# Patient Record
Sex: Female | Born: 1980 | Race: White | Hispanic: No | Marital: Married | Smoking: Never smoker
Health system: Southern US, Community
[De-identification: ages and names within clinical notes are randomized; demographics above are authoritative.]

## PROBLEM LIST (undated history)

## (undated) DIAGNOSIS — G4730 Sleep apnea, unspecified: Secondary | ICD-10-CM

## (undated) DIAGNOSIS — O24419 Gestational diabetes mellitus in pregnancy, unspecified control: Secondary | ICD-10-CM

## (undated) HISTORY — PX: TUBAL LIGATION: SHX77

## (undated) HISTORY — DX: Sleep apnea, unspecified: G47.30

---

## 2003-03-08 ENCOUNTER — Other Ambulatory Visit: Admission: RE | Admit: 2003-03-08 | Discharge: 2003-03-08 | Payer: Self-pay | Admitting: Obstetrics and Gynecology

## 2003-07-04 ENCOUNTER — Ambulatory Visit (HOSPITAL_COMMUNITY): Admission: RE | Admit: 2003-07-04 | Discharge: 2003-07-04 | Payer: Self-pay | Admitting: Obstetrics and Gynecology

## 2003-10-12 ENCOUNTER — Inpatient Hospital Stay (HOSPITAL_COMMUNITY): Admission: AD | Admit: 2003-10-12 | Discharge: 2003-10-12 | Payer: Self-pay | Admitting: Obstetrics and Gynecology

## 2003-10-31 ENCOUNTER — Ambulatory Visit (HOSPITAL_COMMUNITY): Admission: RE | Admit: 2003-10-31 | Discharge: 2003-10-31 | Payer: Self-pay | Admitting: Obstetrics and Gynecology

## 2003-11-10 ENCOUNTER — Inpatient Hospital Stay (HOSPITAL_COMMUNITY): Admission: AD | Admit: 2003-11-10 | Discharge: 2003-11-10 | Payer: Self-pay | Admitting: Obstetrics and Gynecology

## 2003-11-10 ENCOUNTER — Inpatient Hospital Stay (HOSPITAL_COMMUNITY): Admission: AD | Admit: 2003-11-10 | Discharge: 2003-11-13 | Payer: Self-pay | Admitting: Obstetrics and Gynecology

## 2004-05-28 ENCOUNTER — Ambulatory Visit: Payer: Self-pay | Admitting: Family Medicine

## 2005-02-18 ENCOUNTER — Ambulatory Visit: Payer: Self-pay | Admitting: Family Medicine

## 2005-06-11 ENCOUNTER — Ambulatory Visit: Payer: Self-pay | Admitting: Family Medicine

## 2005-06-30 ENCOUNTER — Ambulatory Visit: Payer: Self-pay | Admitting: Family Medicine

## 2006-01-26 ENCOUNTER — Ambulatory Visit: Payer: Self-pay | Admitting: Family Medicine

## 2007-03-22 ENCOUNTER — Encounter: Admission: RE | Admit: 2007-03-22 | Discharge: 2007-03-22 | Payer: Self-pay | Admitting: Obstetrics and Gynecology

## 2007-05-19 ENCOUNTER — Inpatient Hospital Stay (HOSPITAL_COMMUNITY): Admission: RE | Admit: 2007-05-19 | Discharge: 2007-05-21 | Payer: Self-pay | Admitting: Obstetrics and Gynecology

## 2007-08-27 ENCOUNTER — Ambulatory Visit: Payer: Self-pay | Admitting: Specialist

## 2008-02-21 ENCOUNTER — Ambulatory Visit: Payer: Self-pay | Admitting: Family Medicine

## 2008-02-21 DIAGNOSIS — N390 Urinary tract infection, site not specified: Secondary | ICD-10-CM

## 2008-02-21 DIAGNOSIS — R1011 Right upper quadrant pain: Secondary | ICD-10-CM

## 2008-02-21 DIAGNOSIS — I10 Essential (primary) hypertension: Secondary | ICD-10-CM | POA: Insufficient documentation

## 2008-02-21 LAB — CONVERTED CEMR LAB
Glucose, Urine, Semiquant: NEGATIVE
Ketones, urine, test strip: NEGATIVE
Specific Gravity, Urine: 1.025
Urobilinogen, UA: 1
pH: 5.5

## 2008-03-14 ENCOUNTER — Telehealth: Payer: Self-pay | Admitting: Family Medicine

## 2008-10-24 ENCOUNTER — Ambulatory Visit: Payer: Self-pay | Admitting: Family Medicine

## 2008-10-24 DIAGNOSIS — J309 Allergic rhinitis, unspecified: Secondary | ICD-10-CM | POA: Insufficient documentation

## 2008-10-24 LAB — CONVERTED CEMR LAB: Rapid Strep: NEGATIVE

## 2008-10-26 LAB — CONVERTED CEMR LAB
Albumin: 3.8 g/dL (ref 3.5–5.2)
BUN: 11 mg/dL (ref 6–23)
Basophils Absolute: 0.1 10*3/uL (ref 0.0–0.1)
CO2: 28 meq/L (ref 19–32)
Chloride: 108 meq/L (ref 96–112)
Cholesterol: 182 mg/dL (ref 0–200)
Glucose, Bld: 82 mg/dL (ref 70–99)
HCT: 37.6 % (ref 36.0–46.0)
Lymphs Abs: 1.5 10*3/uL (ref 0.7–4.0)
MCHC: 34.4 g/dL (ref 30.0–36.0)
MCV: 81.1 fL (ref 78.0–100.0)
Monocytes Absolute: 0.3 10*3/uL (ref 0.1–1.0)
Neutro Abs: 3.7 10*3/uL (ref 1.4–7.7)
Platelets: 222 10*3/uL (ref 150.0–400.0)
Potassium: 3.9 meq/L (ref 3.5–5.1)
RDW: 13.6 % (ref 11.5–14.6)
TSH: 0.87 microintl units/mL (ref 0.35–5.50)
Total Bilirubin: 0.6 mg/dL (ref 0.3–1.2)
VLDL: 15.4 mg/dL (ref 0.0–40.0)

## 2010-07-21 ENCOUNTER — Encounter: Payer: Self-pay | Admitting: Family Medicine

## 2010-11-14 NOTE — H&P (Signed)
NAMEHARVEEN, [name]                 ACCOUNT NO.:  [id number]   MEDICAL RECORD NO.:  [id number]          PATIENT TYPE:  INP   LOCATION:  9171                          FACILITY:  [facility]   PHYSICIAN:  Lenoard Aden, M.D.DATE OF BIRTH:  Jul 31, 1980   DATE OF ADMISSION:  05/19/2007  DATE OF DISCHARGE:                              HISTORY & PHYSICAL   INDICATION FOR INDUCTION:  Chronic hypertension and gestational  diabetes.   She is a 30 year old white female G2, P1 at [redacted] weeks gestation for  induction.  She has a history of chronic hypertension, on labetalol.  She has a history of gestational diabetes, well-controlled on diet.   CURRENT MEDICATIONS:  1. Prenatal vitamins.  2. Labetalol 100 mg b.i.d.   SOCIAL HISTORY:  She is a nonsmoker, nondrinker.  She denies domestic  physical violence.   ALLERGIES:  HYDROCODONE.   PAST MEDICAL HISTORY:  She has a history of anemia and urinary tract  infection.   FAMILY HISTORY:  She has a family history remarkable for heart disease,  diabetes, thyroid COPD, stroke and bipolar disorder.   OBSTETRIC AND GYNECOLOGICAL HISTORY:  Previous vaginal delivery of a 7  pound 7 ounce female at 22 weeks in 2005.   PHYSICAL EXAMINATION:  GENERAL:  She is a well-developed, well-nourished  white female in no acute distress.  HEENT: Normal.  LUNGS: Clear.  HEART.  Regular rate and rhythm.  ABDOMEN:  Soft, gravid, nontender.  Estimated fetal weight 8 to 8-1/2  pounds.  Cervix is 2-3 cm, __________  , vertex, -1.  EXTREMITIES:  __________ .  NEUROLOGIC:  Nonfocal.  SKIN:  Intact.  VITAL SIGNS:  Blood pressure is as noted, 126/53.   LABORATORY DATA:  Glucose was pending.   IMPRESSION:  1. A 38-week intrauterine pregnancy.  2. Chronic hypertension on Labetalol.  3. Gestational diabetes.   PLAN:  Proceed with induction.  Risks and benefits discussed.      Lenoard Aden, M.D.  Electronically Signed     RJT/MEDQ  D:  05/19/2007  T:   05/19/2007  Job:  [id number]

## 2010-11-20 ENCOUNTER — Encounter: Payer: Self-pay | Admitting: Obstetrics & Gynecology

## 2010-11-27 ENCOUNTER — Encounter: Payer: Self-pay | Admitting: Maternal and Fetal Medicine

## 2010-12-04 ENCOUNTER — Encounter: Payer: Self-pay | Admitting: Maternal & Fetal Medicine

## 2010-12-11 ENCOUNTER — Encounter: Payer: Self-pay | Admitting: Obstetrics and Gynecology

## 2010-12-11 IMAGING — US ULTRAOUND OB LIMITED - NRPT [facility]
1 series · 13 of 13 positions shown · non-contrast
Comparison: none

[Series 1: ultraound ob limited - nrpt [facility] · 13 of 13 slices shown]
[im 1/13]
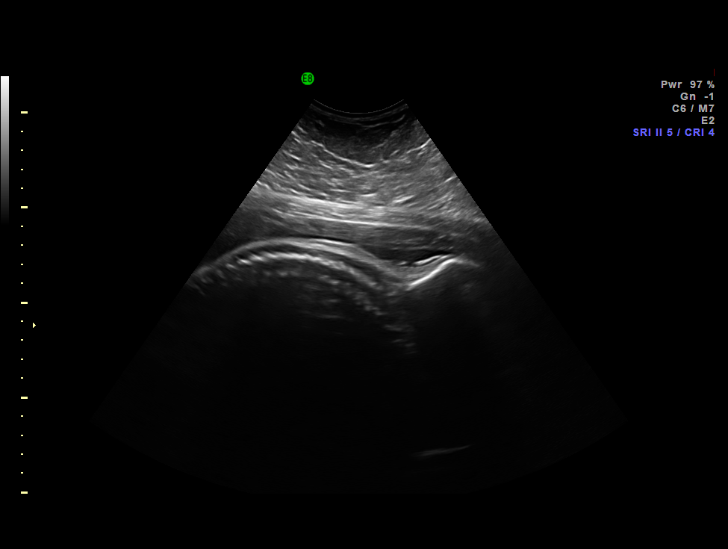
[im 2/13]
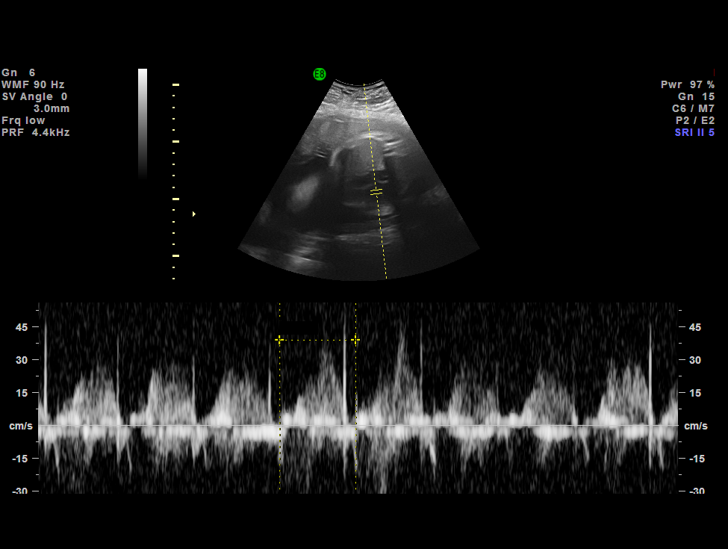
[im 3/13]
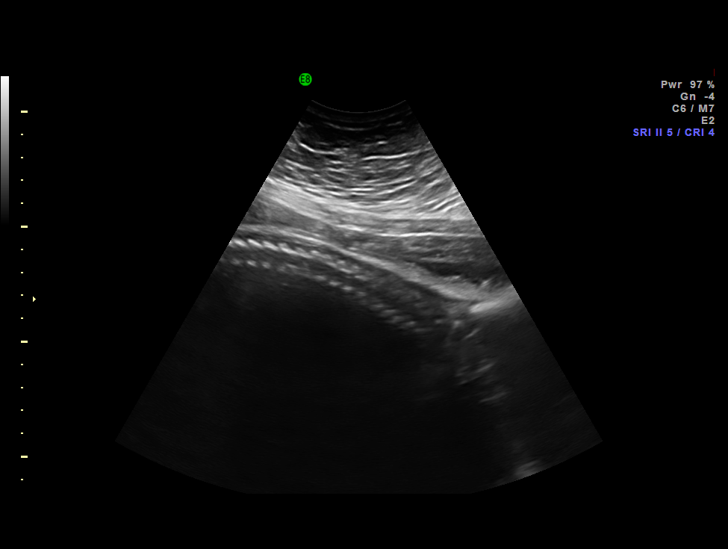
[im 4/13]
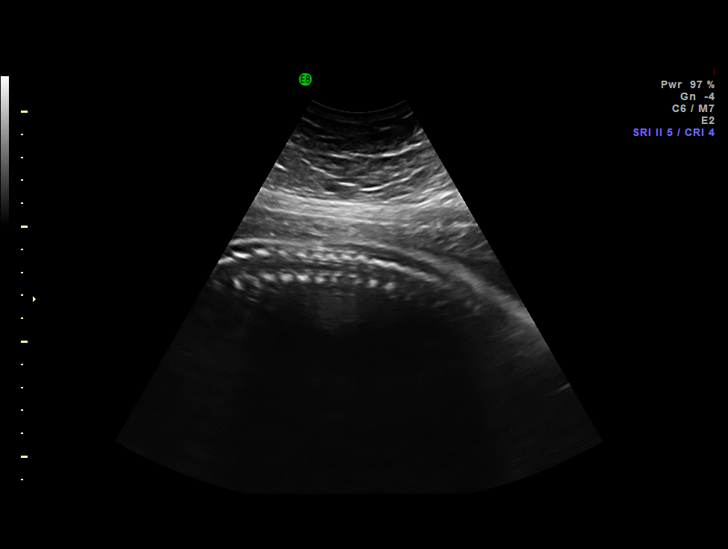
[im 5/13]
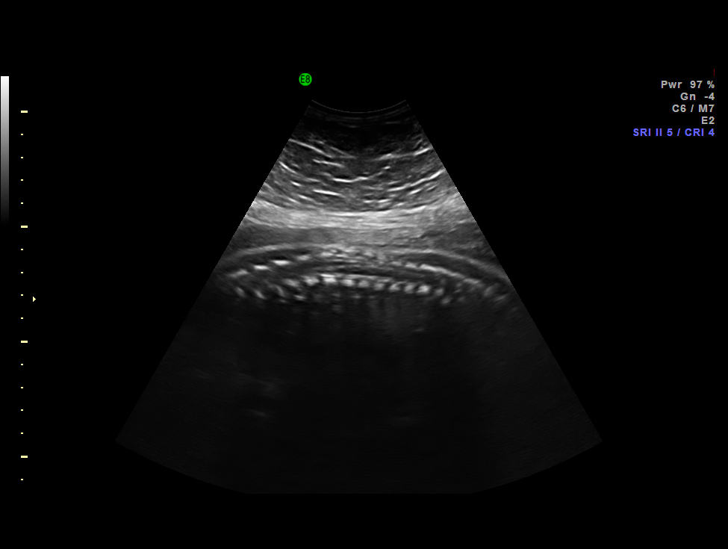
[im 6/13]
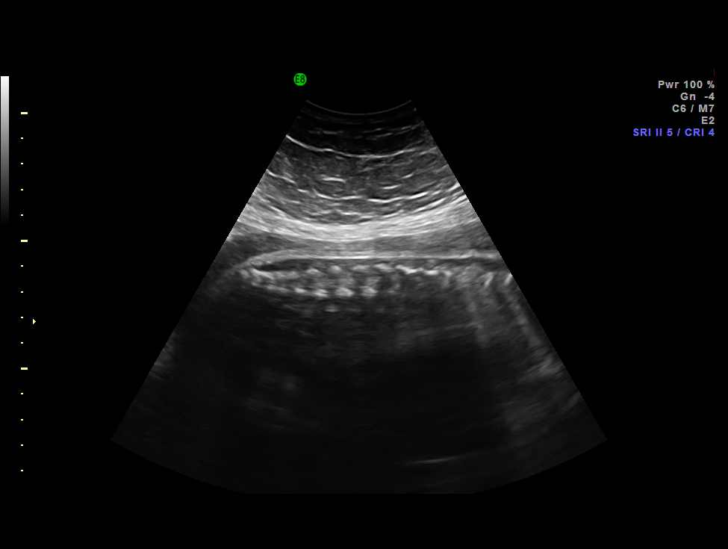
[im 7/13]
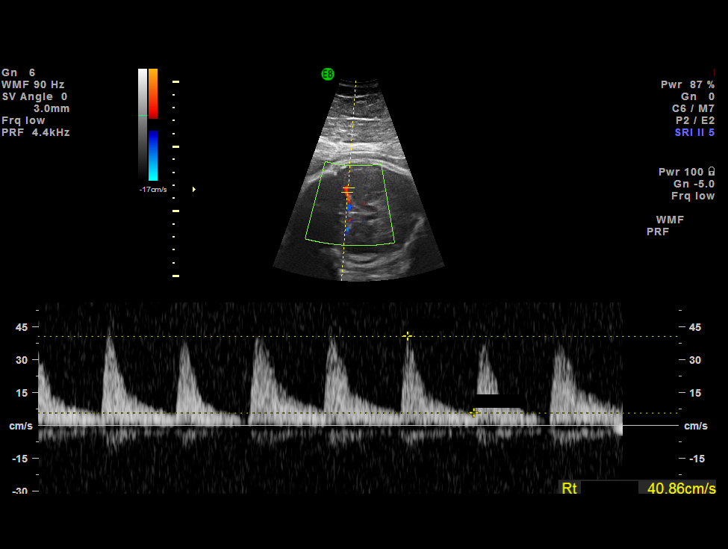
[im 8/13]
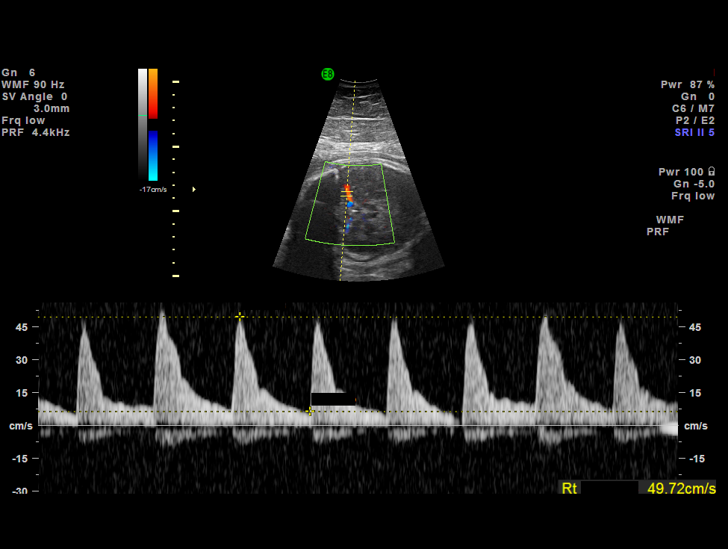
[im 9/13]
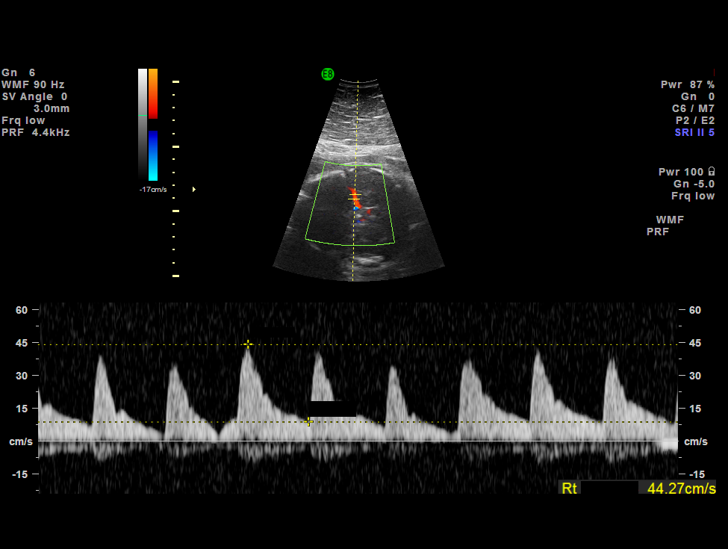
[im 10/13]
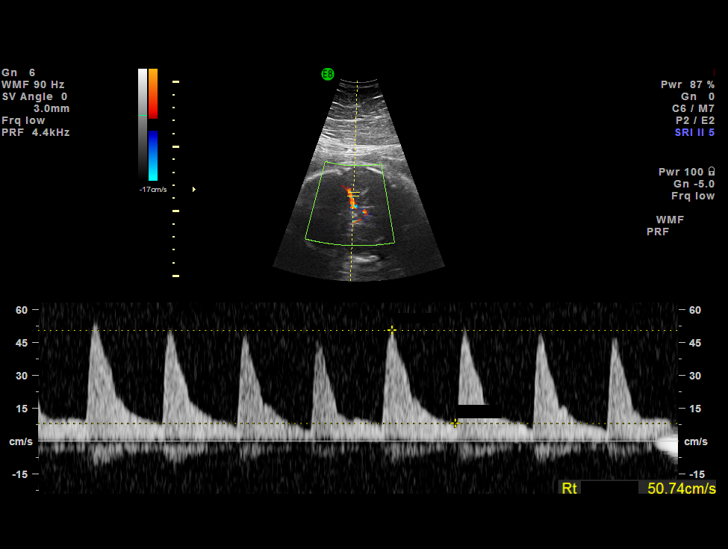
[im 11/13]
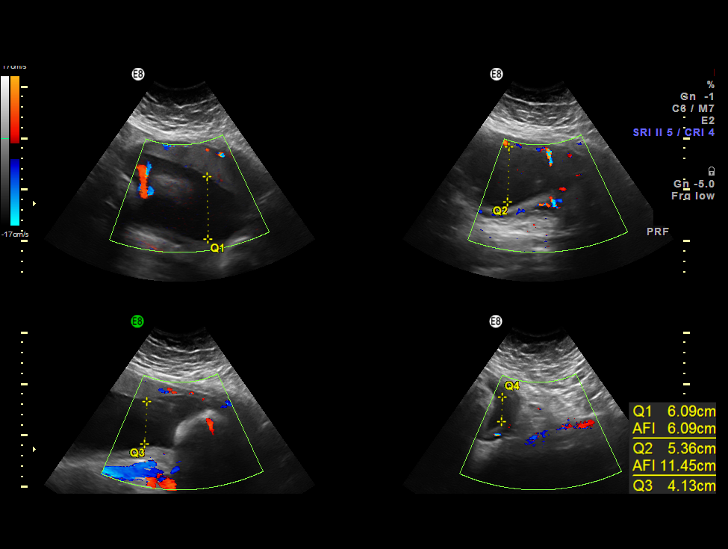
[im 12/13]
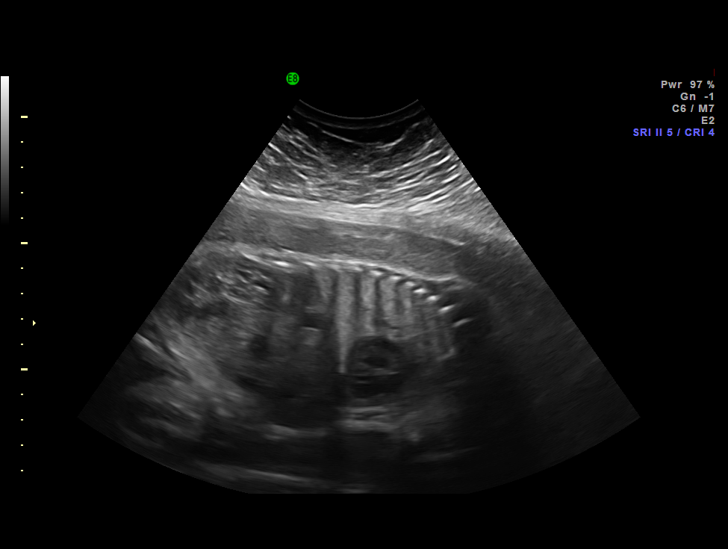
[im 13/13]
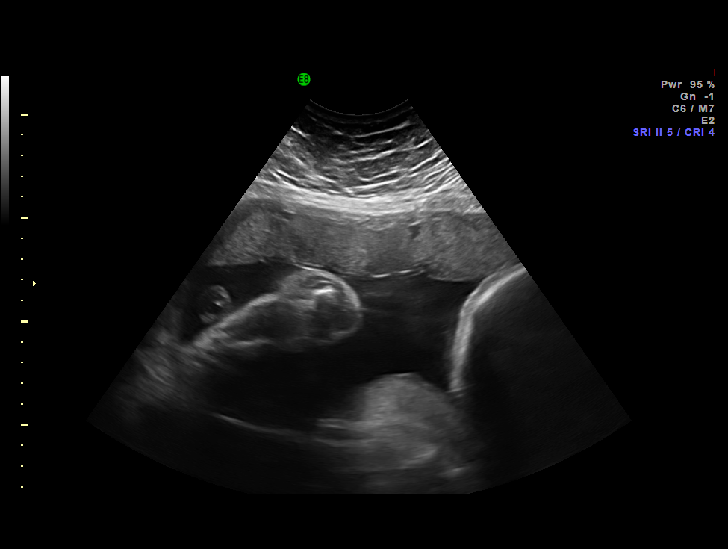

[13 of 13 positions shown; findings below may reference images not displayed]

IMAGES IMPORTED FROM THE SYNGO WORKFLOW SYSTEM
NO DICTATION FOR STUDY

## 2010-12-18 ENCOUNTER — Encounter: Payer: Self-pay | Admitting: Maternal & Fetal Medicine

## 2010-12-25 ENCOUNTER — Encounter: Payer: Self-pay | Admitting: Maternal & Fetal Medicine

## 2011-02-08 ENCOUNTER — Observation Stay: Payer: Self-pay | Admitting: Advanced Practice Midwife

## 2011-02-16 ENCOUNTER — Inpatient Hospital Stay: Payer: Self-pay

## 2011-02-20 LAB — PATHOLOGY REPORT

## 2011-03-10 ENCOUNTER — Ambulatory Visit: Payer: Self-pay

## 2011-04-07 LAB — CBC
HCT: 37.3
Hemoglobin: 13
MCV: 82.7
Platelets: 199
Platelets: 214
RBC: 3.87
WBC: 7.2
WBC: 8.9

## 2011-04-07 LAB — RPR: RPR Ser Ql: NONREACTIVE

## 2011-04-30 ENCOUNTER — Emergency Department: Payer: Self-pay | Admitting: *Deleted

## 2014-06-02 ENCOUNTER — Emergency Department: Payer: Self-pay | Admitting: Student

## 2016-08-13 ENCOUNTER — Ambulatory Visit
Admission: EM | Admit: 2016-08-13 | Discharge: 2016-08-13 | Disposition: A | Discharge status: Home / Self Care | Payer: BLUE CROSS/BLUE SHIELD | Attending: Emergency Medicine | Admitting: Emergency Medicine

## 2016-08-13 ENCOUNTER — Encounter: Payer: Self-pay | Admitting: *Deleted

## 2016-08-13 DIAGNOSIS — J069 Acute upper respiratory infection, unspecified: Secondary | ICD-10-CM | POA: Diagnosis not present

## 2016-08-13 HISTORY — DX: Gestational diabetes mellitus in pregnancy, unspecified control: O24.419

## 2016-08-13 MED ORDER — PSEUDOEPHEDRINE-GUAIFENESIN ER 60-600 MG PO TB12: 1.0000 | ORAL_TABLET | Freq: Two times a day (BID) | ORAL | 0 refills | Status: DC

## 2016-08-13 MED ORDER — FLUCONAZOLE 150 MG PO TABS: ORAL_TABLET | ORAL | 1 refills | Status: DC

## 2016-08-13 MED ORDER — BENZONATATE 100 MG PO CAPS: ORAL_CAPSULE | ORAL | 0 refills | Status: DC

## 2016-08-13 MED ORDER — AMOXICILLIN-POT CLAVULANATE 875-125 MG PO TABS: 1.0000 | ORAL_TABLET | Freq: Two times a day (BID) | ORAL | 0 refills | Status: DC

## 2016-08-13 NOTE — ED Triage Notes (Signed)
Patient started having symptoms of cough, nasal congestion, and head ache 1 week ago.

## 2016-08-13 NOTE — ED Provider Notes (Signed)
CSN: [id number]     Arrival date & time 08/13/16  1037 History   First MD Initiated Contact with Patient 08/13/16 1323     Chief Complaint  Patient presents with  . Cough  . Nasal Congestion   (Consider location/radiation/quality/duration/timing/severity/associated sxs/prior Treatment) HPI  This is a 36 year old female who presents with cough shin and sinus headache. The cough is productive of yellow mucus. She's had sinus pressure along with a headache. Pressure constant and she says is worse with bending over. She has subjective fever and chills. She's had no shortness of breath wheezing over-the-counter medications a day, NyQuil have not been beneficial. He has been using a nasal spray which has given her some relief.        Past Medical History:  Diagnosis Date  . Gestational diabetes    Past Surgical History:  Procedure Laterality Date  . TUBAL LIGATION     Family History  Problem Relation Age of Onset  . Diabetes Mother   . Hypertension Mother   . CAD Mother   . Diabetes Father   . Hypertension Father   . CAD Father    Social History  Substance Use Topics  . Smoking status: Never Smoker  . Smokeless tobacco: Never Used  . Alcohol use No   OB History    No data available     Review of Systems  Constitutional: Positive for activity change, chills, fatigue and fever.  HENT: Positive for congestion, postnasal drip, sinus pain and sinus pressure.   Respiratory: Positive for cough, shortness of breath and wheezing.   All other systems reviewed and are negative.   Allergies  Hydrocodone  Home Medications   Prior to Admission medications   Medication Sig Start Date End Date Taking? Authorizing Provider  amoxicillin-clavulanate (AUGMENTIN) 875-125 MG tablet Take 1 tablet by mouth every 12 (twelve) hours. 08/13/16   Lutricia FeilWilliam P Roemer, PA-C  benzonatate (TESSALON) 100 MG capsule Take one cap TID PRN cough 08/13/16   Lutricia FeilWilliam P Roemer, PA-C  fluconazole (DIFLUCAN)  150 MG tablet Take 1 tablet once for symptoms of yeast infection. 08/13/16   Lutricia FeilWilliam P Roemer, PA-C  pseudoephedrine-guaifenesin (MUCINEX D) 60-600 MG 12 hr tablet Take 1 tablet by mouth every 12 (twelve) hours. 08/13/16   Lutricia FeilWilliam P Roemer, PA-C   Meds Ordered and Administered this Visit  Medications - No data to display  BP (!) 131/91 (BP Location: Left Wrist)   Pulse 73   Temp 98.2 F (36.8 C) (Oral)   Resp 16   Ht 5\' 3"  (1.6 m)   Wt 280 lb (127 kg)   LMP 08/06/2016   SpO2 100%   BMI 49.60 kg/m  No data found.   Physical Exam  Constitutional: She is oriented to person, place, and time. She appears well-developed and well-nourished. No distress.  HENT:  Head: Normocephalic and atraumatic.  Right Ear: External ear normal.  Left Ear: External ear normal.  Nose: Nose normal.  Mouth/Throat: Oropharynx is clear and moist. No oropharyngeal exudate.  She does have tenderness to percussion over the sinuses  Eyes: EOM are normal. Pupils are equal, round, and reactive to light. Right eye exhibits no discharge. Left eye exhibits no discharge.  Neck: Normal range of motion. Neck supple.  Pulmonary/Chest: Effort normal and breath sounds normal. No respiratory distress. She has no wheezes. She has no rales.  Musculoskeletal: Normal range of motion.  Lymphadenopathy:    She has no cervical adenopathy.  Neurological: She is alert and oriented  to person, place, and time.  Skin: Skin is warm and dry. She is not diaphoretic.  Psychiatric: She has a normal mood and affect. Her behavior is normal. Judgment and thought content normal.  Nursing note and vitals reviewed.   Urgent Care Course     Procedures (including critical care time)  Labs Review Labs Reviewed - No data to display  Imaging Review No results found.   Visual Acuity Review  Right Eye Distance:   Left Eye Distance:   Bilateral Distance:    Right Eye Near:   Left Eye Near:    Bilateral Near:         MDM   1.  Acute upper respiratory infection    New Prescriptions   AMOXICILLIN-CLAVULANATE (AUGMENTIN) 875-125 MG TABLET    Take 1 tablet by mouth every 12 (twelve) hours.   BENZONATATE (TESSALON) 100 MG CAPSULE    Take one cap TID PRN cough   FLUCONAZOLE (DIFLUCAN) 150 MG TABLET    Take 1 tablet once for symptoms of yeast infection.   PSEUDOEPHEDRINE-GUAIFENESIN (MUCINEX D) 60-600 MG 12 HR TABLET    Take 1 tablet by mouth every 12 (twelve) hours.  Plan: 1. Test/x-ray results and diagnosis reviewed with patient 2. rx as per orders; risks, benefits, potential side effects reviewed with patient 3. Recommend supportive treatment with Fluids,rest. Tylenol or Motrin for fevers and body aches. Denies with her nasal spray. Follow-up with primary care she is not improving 4. F/u prn if symptoms worsen or don't improve     Lutricia Feil, PA-C 08/13/16 1403

## 2017-03-21 ENCOUNTER — Emergency Department
Admission: EM | Admit: 2017-03-21 | Discharge: 2017-03-21 | Disposition: A | Discharge status: Home / Self Care | Payer: BLUE CROSS/BLUE SHIELD | Attending: Emergency Medicine | Admitting: Emergency Medicine

## 2017-03-21 ENCOUNTER — Encounter: Payer: Self-pay | Admitting: Emergency Medicine

## 2017-03-21 DIAGNOSIS — K0889 Other specified disorders of teeth and supporting structures: Secondary | ICD-10-CM

## 2017-03-21 DIAGNOSIS — I10 Essential (primary) hypertension: Secondary | ICD-10-CM | POA: Diagnosis not present

## 2017-03-21 MED ORDER — PENICILLIN V POTASSIUM 500 MG PO TABS: 500.0000 mg | ORAL_TABLET | Freq: Four times a day (QID) | ORAL | 0 refills | Status: AC

## 2017-03-21 MED ORDER — NAPROXEN 500 MG PO TABS: 500.0000 mg | ORAL_TABLET | Freq: Two times a day (BID) | ORAL | 0 refills | Status: AC

## 2017-03-21 NOTE — ED Triage Notes (Signed)
Pt reports having pain to left lower gum where wisdom tooth is coming in.  Reports pain was so bad yesterday saw spots and blood pressure was up from it.  Does not have a Education officer, community.

## 2017-03-21 NOTE — ED Provider Notes (Signed)
[facility] Emergency Department Provider Note  ___________________________________________   First MD Initiated Contact with Patient 03/21/17 1523     (approximate)  I have reviewed the triage vital signs and the nursing notes.   HISTORY  Chief Complaint Dental Pain  HPI [name] is a 36 y.o. female is her complaint of dental pain for 5 weeks.Patient states she also blew she has TMJ area she has had some popping in her jaw especially when she is eating things. She is not taking any medication for this. She states that she does not have a dentist at this time has not made an appointment. She denies any fever or chills. Currently she rates her pain as 9/10.   Past Medical History:  Diagnosis Date  . Gestational diabetes     Patient Active Problem List   Diagnosis Date Noted  . ALLERGIC RHINITIS 10/24/2008  . HYPERTENSION 02/21/2008  . UTI'S, CHRONIC 02/21/2008  . ABDOMINAL PAIN RIGHT UPPER QUADRANT 02/21/2008    Past Surgical History:  Procedure Laterality Date  . TUBAL LIGATION      Prior to Admission medications   Medication Sig Start Date End Date Taking? Authorizing Provider  naproxen (NAPROSYN) 500 MG tablet Take 1 tablet (500 mg total) by mouth 2 (two) times daily with a meal. 03/21/17   Bridget Hartshorn L, PA-C  penicillin v potassium (VEETID) 500 MG tablet Take 1 tablet (500 mg total) by mouth 4 (four) times daily. 03/21/17   Tommi Rumps, PA-C    Allergies Hydrocodone  Family History  Problem Relation Age of Onset  . Diabetes Mother   . Hypertension Mother   . CAD Mother   . Diabetes Father   . Hypertension Father   . CAD Father     Social History Social History  Substance Use Topics  . Smoking status: Never Smoker  . Smokeless tobacco: Never Used  . Alcohol use No    Review of Systems Constitutional: No fever/chills Eyes: No visual changes. ENT: Positive dental pain. Cardiovascular: Denies chest  pain. Respiratory: Denies shortness of breath. Gastrointestinal:  No nausea, no vomiting.   Neurological: Negative for headaches ____________________________________________   PHYSICAL EXAM:  VITAL SIGNS: ED Triage Vitals  Enc Vitals Group     BP 03/21/17 1507 132/84     Pulse Rate 03/21/17 1507 81     Resp 03/21/17 1507 18     Temp 03/21/17 1507 98.3 F (36.8 C)     Temp Source 03/21/17 1507 Oral     SpO2 03/21/17 1507 100 %     Weight 03/21/17 1506 280 lb (127 kg)     Height 03/21/17 1506  (1.6 m)     Head Circumference --      Peak Flow --      Pain Score 03/21/17 1505 9     Pain Loc --      Pain Edu? --      Excl. in GC? --    Constitutional: Alert and oriented. Well appearing and in no acute distress. Eyes: Conjunctivae are normal.  Head: Atraumatic. Nose: No congestion/rhinnorhea. Mouth/Throat: Mucous membranes are moist.  Oropharynx non-erythematous. Left lower wisdom tooth is partially visible through the gum. There is swelling in the gum area. No active drainage seen. Patient also has moderate amount of tenderness on palpation externally just below the same area. There is no point tenderness on palpation of the TMJ area. Neck: No stridor.   Hematological/Lymphatic/Immunilogical: No cervical lymphadenopathy.  Cardiovascular: Normal rate, regular rhythm. Grossly normal heart sounds.  Good peripheral circulation. Respiratory: Normal respiratory effort.  No retractions. Lungs CTAB. Neurologic:  Normal speech and language. No gross focal neurologic deficits are appreciated. No gait instability. Skin:  Skin is warm, dry and intact.  Psychiatric: Mood and affect are normal. Speech and behavior are normal.  ____________________________________________   LABS (all labs ordered are listed, but only abnormal results are displayed)  Labs Reviewed - No data to display  PROCEDURES  Procedure(s) performed: None  Procedures  Critical Care performed:  No  ____________________________________________   INITIAL IMPRESSION / ASSESSMENT AND PLAN / ED COURSE  Pertinent labs & imaging results that were available during my care of the patient were reviewed by me and considered in my medical decision making (see chart for details).  Patient was given list of dental clinics in the area and encouraged to call and make arrangements for dental care. She is also given the hours for the walk-in dental clinic at [facility]. Patient was started on Pen-Vee K 500 mg 4 times a day for the next 7 days and naproxen 500 mg twice a day with food. Patient is encouraged to discontinue chewing gum or eating foods that require a lot of chewing.  There is no tenderness in the TMJ area and this is felt to be more of an abscess.   ___________________________________________   FINAL CLINICAL IMPRESSION(S) / ED DIAGNOSES  Final diagnoses:  Pain, dental      NEW MEDICATIONS STARTED DURING THIS VISIT:  New Prescriptions   NAPROXEN (NAPROSYN) 500 MG TABLET    Take 1 tablet (500 mg total) by mouth 2 (two) times daily with a meal.   PENICILLIN V POTASSIUM (VEETID) 500 MG TABLET    Take 1 tablet (500 mg total) by mouth 4 (four) times daily.     Note:  This document was prepared using Dragon voice recognition software and may include unintentional dictation errors.    Tommi Rumps, PA-C 03/21/17 1542    Governor Rooks, MD 03/21/17 [phone]

## 2017-03-21 NOTE — Discharge Instructions (Signed)
Begin taking antibiotics as directed. Naproxen 500 mg twice a day with food. Also avoid eating foods that cause a lot of chewing and avoid chewing gum. This will help with some popping in your TMJ. Make an appointment with one of the dental clinics. Information is given to you and also consider [facility] walk-in clinic.  OPTIONS FOR DENTAL FOLLOW UP CARE  [place] Department of Health and Human Services - Local Safety Net Dental Clinics [email or web address]   [facility] [phone])  [facility] [phone])  [facility] [phone] ext [phone])  [facility] [phone])  [facility] [phone]) This clinic caters to the indigent population and is on a lottery system. Location: [employer] [facility], [employer], [address] [place] Clinic Hours: Wednesdays from 6pm - 9pm, patients seen by a lottery system. For dates, call or go to [email or web address] Services: Cleanings, fillings and simple extractions. Payment Options: DENTAL WORK IS FREE OF CHARGE. Bring proof of income or support. Best way to get seen: Arrive at 5:15 pm - this is a lottery, NOT first come/first serve, so arriving earlier will not increase your chances of being seen.     [facility] [phone] Select option 1 for emergencies   Location: [facility], [place], [address] [place] Clinic Hours: No walk-ins accepted - call the day before to schedule an appointment. Check in times are 9:30 am and 1:30 pm. Services: Simple extractions, temporary fillings, pulpectomy/pulp debridement, uncomplicated abscess drainage. Payment Options: PAYMENT IS DUE AT THE TIME OF SERVICE.  Fee is usually $100-200, additional surgical procedures (e.g. abscess drainage) may be extra. Cash, checks, Visa/MasterCard accepted.  Can  file Medicaid if patient is covered for dental - patient should call case worker to check. No discount for [facility] patients. Best way to get seen: MUST call the day before and get onto the schedule. Can usually be seen the next 1-2 days. No walk-ins accepted.     [facility] [phone]   Location: [facility], [address] [place] Clinic Hours: M, W, Th, F 8am or 1:30pm, Tues 9a or 1:30 - first come/first served. Services: Simple extractions, temporary fillings, uncomplicated abscess drainage.  You do not need to be an [facility] resident. Payment Options: PAYMENT IS DUE AT THE TIME OF SERVICE. Dental insurance, otherwise sliding scale - bring proof of income or support. Depending on income and treatment needed, cost is usually $50-200. Best way to get seen: Arrive early as it is first come/first served.     [facility] [phone]   Location: [address] Clinic Hours: Mon-Thu 8a-5p Services: Most basic dental services including extractions and fillings. Payment Options: PAYMENT IS DUE AT THE TIME OF SERVICE. Sliding scale, up to 50% off - bring proof if income or support. Medicaid with dental option accepted. Best way to get seen: Call to schedule an appointment, can usually be seen within 2 weeks OR they will try to see walk-ins - show up at 8a or 2p (you may have to wait).     [facility] [phone] [place] RESIDENTS ONLY   Location: [facility], [address] [place], [place] [address] Clinic Hours: By appointment only. Monday - Thursday 8am-5pm, Friday 8am-12pm Services: Cleanings, fillings, extractions. Payment Options: PAYMENT IS DUE AT THE TIME OF SERVICE. Cash, Visa or MasterCard. Sliding scale - $30 minimum per  service. Best way to get seen: Come in to office, complete packet and make an appointment - need proof  of income or support monies for each household member and proof of [facility] residence. Usually takes about a month to get in.     [facility] [phone]   Location: [address], [facility] Clinic Hours: Walk-in Urgent Care Dental Services are offered Monday-Friday mornings only. The numbers of emergencies accepted daily is limited to the number of providers available. Maximum 15 - Mondays, Wednesdays & Thursdays Maximum 10 - Tuesdays & Fridays Services: You do not need to be a [facility] resident to be seen for a dental emergency. Emergencies are defined as pain, swelling, abnormal bleeding, or dental trauma. Walkins will receive x-rays if needed. NOTE: Dental cleaning is not an emergency. Payment Options: PAYMENT IS DUE AT THE TIME OF SERVICE. Minimum co-pay is $40.00 for uninsured patients. Minimum co-pay is $3.00 for Medicaid with dental coverage. Dental Insurance is accepted and must be presented at time of visit. Medicare does not cover dental. Forms of payment: Cash, credit card, checks. Best way to get seen: If not previously registered with the clinic, walk-in dental registration begins at 7:15 am and is on a first come/first serve basis. If previously registered with the clinic, call to make an appointment.     [facility] [phone] [place] RESIDENTS ONLY   Location: [address] [place], [place] Clinic Hours: Mon-Thu 10a-2p Services: Extractions only! Payment Options: FREE (donations accepted) - bring proof of income or support Best way to get seen: Call and schedule an appointment OR come at 8am on the 1st Monday of every month (except for holidays) when it is first come/first served.     [facility] [phone]   Location: [address] [place], [place] Clinic Hours: Friday mornings Services, Payment Options, Best way to get seen: Call for info

## 2017-08-16 ENCOUNTER — Other Ambulatory Visit
Admission: RE | Admit: 2017-08-16 | Discharge: 2017-08-16 | Disposition: A | Discharge status: Home / Self Care | Payer: BLUE CROSS/BLUE SHIELD | Source: Ambulatory Visit | Attending: Internal Medicine | Admitting: Internal Medicine

## 2017-08-16 DIAGNOSIS — Z6841 Body Mass Index (BMI) 40.0 and over, adult: Secondary | ICD-10-CM | POA: Insufficient documentation

## 2017-08-16 DIAGNOSIS — R079 Chest pain, unspecified: Secondary | ICD-10-CM | POA: Diagnosis present

## 2017-08-16 DIAGNOSIS — R5383 Other fatigue: Secondary | ICD-10-CM | POA: Diagnosis present

## 2017-08-16 DIAGNOSIS — Z0000 Encounter for general adult medical examination without abnormal findings: Secondary | ICD-10-CM | POA: Insufficient documentation

## 2017-08-16 LAB — FIBRIN DERIVATIVES D-DIMER (ARMC ONLY): Fibrin derivatives D-dimer (ARMC): 325.14 ng/mL (FEU) (ref 0.00–499.00)

## 2017-09-27 DIAGNOSIS — G4730 Sleep apnea, unspecified: Secondary | ICD-10-CM

## 2017-09-27 HISTORY — DX: Sleep apnea, unspecified: G47.30

## 2018-03-31 NOTE — Progress Notes (Signed)
Gynecology Annual Exam   PCP: Nelwyn Salisbury, MD  Chief Complaint:  Chief Complaint  Patient presents with  . Gynecologic Exam    Spotting inbetween periods x 1 month with LQ pain     History of Present Illness: Patient is a 37 y.o. No obstetric history on file. presents for annual exam. The patient has no complaints today.   LMP: No LMP recorded. (Menstrual status: Irregular Periods). Average Interval: regular, 28 days Duration of flow: 3 days Heavy Menses: no Clots: no Intermenstrual Bleeding: yes (one episode in the past month) Postcoital Bleeding: no Dysmenorrhea: no  The patient is sexually active. She currently uses tubal ligation for contraception. She denies dyspareunia.  The patient does perform self breast exams.  There is no notable family history of breast or ovarian cancer in her family.  The patient wears seatbelts: yes.   The patient has regular exercise: not asked.    The patient denies current symptoms of depression.    Paitient is a 37 y.o. No obstetric history on file. who LMP was No LMP recorded. (Menstrual status: Irregular Periods)., presents today for a problem visit.  She complains of one episode of intramenstrual bleeding that  began two weeks ago and its severity is described as mild.  The patient menstrual complaints are acute.  She did have some associated sharp left sided pelvic pain.  Review of Systems: Review of Systems  Constitutional: Negative for chills and fever.  HENT: Negative for congestion.   Respiratory: Negative for cough and shortness of breath.   Cardiovascular: Negative for chest pain and palpitations.  Gastrointestinal: Negative for abdominal pain, constipation, diarrhea, heartburn, nausea and vomiting.  Genitourinary: Negative for dysuria, frequency and urgency.  Skin: Negative for itching and rash.  Neurological: Negative for dizziness and headaches.  Endo/Heme/Allergies: Negative for polydipsia.  Psychiatric/Behavioral:  Negative for depression.   :   Past Medical History:  Past Medical History:  Diagnosis Date  . Gestational diabetes   . Sleep apnea 09/2017    Past Surgical History:  Past Surgical History:  Procedure Laterality Date  . TUBAL LIGATION      Gynecologic History:  No LMP recorded. (Menstrual status: Irregular Periods). Contraception: tubal ligation  Obstetric History: No obstetric history on file.  Family History:  Family History  Problem Relation Age of Onset  . Diabetes Mother   . Hypertension Mother   . CAD Mother   . Diabetes Father   . Hypertension Father   . CAD Father     Social History:  Social History   Socioeconomic History  . Marital status: Married    Spouse name: Not on file  . Number of children: Not on file  . Years of education: Not on file  . Highest education level: Not on file  Occupational History  . Not on file  Social Needs  . Financial resource strain: Not on file  . Food insecurity:    Worry: Not on file    Inability: Not on file  . Transportation needs:    Medical: Not on file    Non-medical: Not on file  Tobacco Use  . Smoking status: Never Smoker  . Smokeless tobacco: Never Used  Substance and Sexual Activity  . Alcohol use: No  . Drug use: No  . Sexual activity: Yes    Birth control/protection: Surgical    Comment: Tubal ligation   Lifestyle  . Physical activity:    Days per week: Not on file  Minutes per session: Not on file  . Stress: Not on file  Relationships  . Social connections:    Talks on phone: Not on file    Gets together: Not on file    Attends religious service: Not on file    Active member of club or organization: Not on file    Attends meetings of clubs or organizations: Not on file    Relationship status: Not on file  . Intimate partner violence:    Fear of current or ex partner: Not on file    Emotionally abused: Not on file    Physically abused: Not on file    Forced sexual activity: Not on  file  Other Topics Concern  . Not on file  Social History Narrative  . Not on file    Allergies:  Allergies  Allergen Reactions  . Hydrocodone     Medications: Prior to Admission medications   Medication Sig Start Date End Date Taking? Authorizing Provider  naproxen (NAPROSYN) 500 MG tablet Take 1 tablet (500 mg total) by mouth 2 (two) times daily with a meal. 03/21/17   Bridget Hartshorn L, PA-C  penicillin v potassium (VEETID) 500 MG tablet Take 1 tablet (500 mg total) by mouth 4 (four) times daily. 03/21/17   Tommi Rumps, PA-C    Physical Exam Vitals: Blood pressure 120/80, pulse 96, height 5\' 2"  (1.575 m), weight 295 lb (133.8 kg).   General: NAD HEENT: normocephalic, anicteric Thyroid: no enlargement, no palpable nodules Pulmonary: No increased work of breathing, CTAB Cardiovascular: RRR, distal pulses 2+ Breast: Breast symmetrical, no tenderness, no palpable nodules or masses, no skin or nipple retraction present, no nipple discharge.  No axillary or supraclavicular lymphadenopathy. Abdomen: NABS, soft, non-tender, non-distended.  Umbilicus without lesions.  No hepatomegaly, splenomegaly or masses palpable. No evidence of hernia  Genitourinary:  External: Normal external female genitalia.  Normal urethral meatus, normal Bartholin's and Skene's glands.    Vagina: Normal vaginal mucosa, no evidence of prolapse.    Cervix: Grossly normal in appearance, no bleeding  Uterus: Non-enlarged, mobile, normal contour.  No CMT  Adnexa: ovaries non-enlarged, no adnexal masses  Rectal: deferred  Lymphatic: no evidence of inguinal lymphadenopathy Extremities: no edema, erythema, or tenderness Neurologic: Grossly intact Psychiatric: mood appropriate, affect full  Female chaperone present for pelvic and breast  portions of the physical exam    Assessment: 37 y.o. No obstetric history on file. routine annual exam  Plan: Problem List Items Addressed This Visit    None      Visit Diagnoses    Cervical cancer screening    -  Primary   Relevant Orders   Cytology - PAP   Abnormal uterine bleeding       Encounter for gynecological examination without abnormal finding       Breast screening         Annual  1) STI screening  wasoffered and declined  2)  ASCCP guidelines and rational discussed.  Patient opts for every 3 years screening interval  3) Contraception - the patient is currently using  tubal ligation.  She is happy with her current form of contraception and plans to continue  4) Routine healthcare maintenance including cholesterol, diabetes screening discussed managed by PCP  5) Return in about 1 year (around 04/02/2019) for annual.   AUB   1) Single episode of intermenstrual spotting week 2 of cycle.  Also some sharp left sided pain.  Likely mittleschmerz with spotting related to  ovulation.  Discussed that approximately 10% of women may experience ovulation bleeding.  If repeat episodes of spotting next step would be TVUS for evaluation of uterine structural abnormalities.     Vena Austria, MD, [facility], [facility] 04/01/2018, 7:50 PM

## 2018-04-01 ENCOUNTER — Other Ambulatory Visit (HOSPITAL_COMMUNITY)
Admission: RE | Admit: 2018-04-01 | Discharge: 2018-04-01 | Disposition: A | Discharge status: Home / Self Care | Payer: BLUE CROSS/BLUE SHIELD | Source: Ambulatory Visit | Attending: Obstetrics and Gynecology | Admitting: Obstetrics and Gynecology

## 2018-04-01 ENCOUNTER — Encounter: Payer: Self-pay | Admitting: Obstetrics and Gynecology

## 2018-04-01 ENCOUNTER — Ambulatory Visit (INDEPENDENT_AMBULATORY_CARE_PROVIDER_SITE_OTHER): Payer: BLUE CROSS/BLUE SHIELD | Admitting: Obstetrics and Gynecology

## 2018-04-01 VITALS — BP 120/80 | HR 96 | Ht 62.0 in | Wt 295.0 lb

## 2018-04-01 DIAGNOSIS — Z01419 Encounter for gynecological examination (general) (routine) without abnormal findings: Secondary | ICD-10-CM

## 2018-04-01 DIAGNOSIS — Z1239 Encounter for other screening for malignant neoplasm of breast: Secondary | ICD-10-CM

## 2018-04-01 DIAGNOSIS — Z124 Encounter for screening for malignant neoplasm of cervix: Secondary | ICD-10-CM | POA: Diagnosis present

## 2018-04-01 DIAGNOSIS — N939 Abnormal uterine and vaginal bleeding, unspecified: Secondary | ICD-10-CM

## 2018-04-05 LAB — CYTOLOGY - PAP
Diagnosis: NEGATIVE
HPV: NOT DETECTED

## 2019-06-28 ENCOUNTER — Ambulatory Visit: Payer: BC Managed Care – PPO | Attending: Internal Medicine

## 2019-06-28 DIAGNOSIS — Z20822 Contact with and (suspected) exposure to covid-19: Secondary | ICD-10-CM

## 2019-06-29 LAB — NOVEL CORONAVIRUS, NAA: SARS-CoV-2, NAA: NOT DETECTED

## 2019-07-31 ENCOUNTER — Ambulatory Visit: Payer: Self-pay | Attending: Internal Medicine

## 2019-07-31 DIAGNOSIS — Z20822 Contact with and (suspected) exposure to covid-19: Secondary | ICD-10-CM | POA: Insufficient documentation

## 2019-08-01 ENCOUNTER — Telehealth: Payer: Self-pay | Admitting: Family Medicine

## 2019-08-01 LAB — NOVEL CORONAVIRUS, NAA: SARS-CoV-2, NAA: NOT DETECTED

## 2019-08-01 NOTE — Telephone Encounter (Signed)
Patient is calling to receive her COVID test results. Patient expressed understanding. 

## 2019-08-16 ENCOUNTER — Encounter: Payer: Self-pay | Admitting: Emergency Medicine

## 2019-08-16 ENCOUNTER — Emergency Department: Payer: Self-pay

## 2019-08-16 ENCOUNTER — Emergency Department
Admission: EM | Admit: 2019-08-16 | Discharge: 2019-08-16 | Disposition: A | Discharge status: Home / Self Care | Payer: Self-pay | Attending: Emergency Medicine | Admitting: Emergency Medicine

## 2019-08-16 ENCOUNTER — Other Ambulatory Visit: Payer: Self-pay

## 2019-08-16 DIAGNOSIS — R1032 Left lower quadrant pain: Secondary | ICD-10-CM | POA: Insufficient documentation

## 2019-08-16 DIAGNOSIS — K529 Noninfective gastroenteritis and colitis, unspecified: Secondary | ICD-10-CM | POA: Insufficient documentation

## 2019-08-16 LAB — URINALYSIS, COMPLETE (UACMP) WITH MICROSCOPIC
Bacteria, UA: NONE SEEN
Bilirubin Urine: NEGATIVE
Glucose, UA: NEGATIVE mg/dL
Ketones, ur: NEGATIVE mg/dL
Nitrite: NEGATIVE
Protein, ur: 100 mg/dL — AB
Specific Gravity, Urine: 1.026 (ref 1.005–1.030)
pH: 5 (ref 5.0–8.0)

## 2019-08-16 LAB — COMPREHENSIVE METABOLIC PANEL
ALT: 20 U/L (ref 0–44)
AST: 18 U/L (ref 15–41)
Albumin: 3.7 g/dL (ref 3.5–5.0)
Alkaline Phosphatase: 64 U/L (ref 38–126)
Anion gap: 11 (ref 5–15)
BUN: 12 mg/dL (ref 6–20)
CO2: 24 mmol/L (ref 22–32)
Calcium: 9 mg/dL (ref 8.9–10.3)
Chloride: 104 mmol/L (ref 98–111)
Creatinine, Ser: 0.79 mg/dL (ref 0.44–1.00)
GFR calc Af Amer: >60 mL/min (ref >60)
GFR calc non Af Amer: >60 mL/min (ref >60)
Glucose, Bld: 97 mg/dL (ref 70–99)
Potassium: 4.2 mmol/L (ref 3.5–5.1)
Sodium: 139 mmol/L (ref 135–145)
Total Bilirubin: 0.5 mg/dL (ref 0.3–1.2)
Total Protein: 7 g/dL (ref 6.5–8.1)

## 2019-08-16 LAB — LIPASE, BLOOD: Lipase: 24 U/L (ref 11–51)

## 2019-08-16 LAB — CBC
HCT: 38 % (ref 36.0–46.0)
Hemoglobin: 12.1 g/dL (ref 12.0–15.0)
MCH: 25.8 pg — ABNORMAL LOW (ref 26.0–34.0)
MCHC: 31.8 g/dL (ref 30.0–36.0)
MCV: 81 fL (ref 80.0–100.0)
Platelets: 312 10*3/uL (ref 150–400)
RBC: 4.69 MIL/uL (ref 3.87–5.11)
RDW: 14.6 % (ref 11.5–15.5)
WBC: 7.9 10*3/uL (ref 4.0–10.5)
nRBC: 0 % (ref 0.0–0.2)

## 2019-08-16 LAB — POCT PREGNANCY, URINE: Preg Test, Ur: NEGATIVE

## 2019-08-16 MED ORDER — IOHEXOL 300 MG/ML  SOLN: 100.0000 mL | Freq: Once | INTRAMUSCULAR | Status: DC | PRN

## 2019-08-16 MED ORDER — IOHEXOL 9 MG/ML PO SOLN
1000.0000 mL | Freq: Once | ORAL | Status: AC | PRN
  Administered 2019-08-16: 1000 mL via ORAL

## 2019-08-16 MED ORDER — AMOXICILLIN-POT CLAVULANATE 875-125 MG PO TABS
1.0000 | ORAL_TABLET | Freq: Once | ORAL | Status: AC
  Administered 2019-08-16: 1 via ORAL
  Filled 2019-08-16: qty 1

## 2019-08-16 MED ORDER — AMOXICILLIN-POT CLAVULANATE 875-125 MG PO TABS: 1.0000 | ORAL_TABLET | Freq: Two times a day (BID) | ORAL | 0 refills | Status: AC

## 2019-08-16 MED ORDER — IOHEXOL 300 MG/ML  SOLN
125.0000 mL | Freq: Once | INTRAMUSCULAR | Status: AC | PRN
  Administered 2019-08-16: 125 mL via INTRAVENOUS

## 2019-08-16 NOTE — Discharge Instructions (Signed)
Please take antibiotics as prescribed for their entire course.  Please call the number provided for GI medicine to arrange a follow-up appointment.  Return to the emergency department for any worsening pain, fever, or any other symptom personally concerning to yourself.

## 2019-08-16 NOTE — ED Triage Notes (Signed)
Patient reports LLQ abdominal pain, worsening over the last 2 days. Reports HA and nausea. Denies vomiting, diarrhea.

## 2019-08-16 NOTE — ED Provider Notes (Signed)
[facility] Emergency Department Provider Note  Time seen: 11:54 AM  I have reviewed the triage vital signs and the nursing notes.   HISTORY  Chief Complaint Abdominal Pain    HPI [name] is a 39 y.o. female with no significant past medical history presents to the emergency department for left lower quadrant abdominal pain.  According to the patient for the past several months she has been experiencing intermittent left lower quadrant abdominal pain although she states acutely worse over the past 24 to 48 hours.  States intermittent constipation diarrhea, but none of which have occurred recently.  Denies any dysuria or hematuria.  No fever cough or shortness of breath.  Scribes her pain as moderate dull pain mostly in the left lower quadrant.   Past Medical History:  Diagnosis Date  . Gestational diabetes   . Sleep apnea 09/2017    Patient Active Problem List   Diagnosis Date Noted  . ALLERGIC RHINITIS 10/24/2008  . HYPERTENSION 02/21/2008  . UTI'S, CHRONIC 02/21/2008  . ABDOMINAL PAIN RIGHT UPPER QUADRANT 02/21/2008    Past Surgical History:  Procedure Laterality Date  . TUBAL LIGATION      Prior to Admission medications   Medication Sig Start Date End Date Taking? Authorizing Provider  cetirizine (ZYRTEC) 10 MG tablet Take 10 mg by mouth daily.    [provider]  naproxen (NAPROSYN) 500 MG tablet Take 1 tablet (500 mg total) by mouth 2 (two) times daily with a meal. Patient not taking: Reported on 04/01/2018 03/21/17   Johnn Hai, PA-C  penicillin v potassium (VEETID) 500 MG tablet Take 1 tablet (500 mg total) by mouth 4 (four) times daily. Patient not taking: Reported on 04/01/2018 03/21/17   Johnn Hai, PA-C    Allergies  Allergen Reactions  . Hydrocodone     Family History  Problem Relation Age of Onset  . Diabetes Mother   . Hypertension Mother   . CAD Mother   . Diabetes Father   . Hypertension Father    . CAD Father     Social History Social History   Tobacco Use  . Smoking status: Never Smoker  . Smokeless tobacco: Never Used  Substance Use Topics  . Alcohol use: No  . Drug use: No    Review of Systems Constitutional: Negative for fever. Cardiovascular: Negative for chest pain. Respiratory: Negative for shortness of breath. Gastrointestinal: Intermittent left lower quadrant abdominal pain with intermittent diarrhea and constipation.  No nausea or vomiting. Genitourinary: Negative for urinary compaints Musculoskeletal: Negative for musculoskeletal complaints Neurological: Negative for headache All other ROS negative  ____________________________________________   PHYSICAL EXAM:  VITAL SIGNS: ED Triage Vitals  Enc Vitals Group     BP 08/16/19 1141 (!) 140/96     Pulse Rate 08/16/19 1141 (!) 105     Resp 08/16/19 1141 20     Temp 08/16/19 1141 99.1 F (37.3 C)     Temp Source 08/16/19 1141 Oral     SpO2 08/16/19 1141 100 %     Weight 08/16/19 1135 (!) 310 lb (140.6 kg)     Height 08/16/19 1135 5\' 2"  (1.575 m)     Head Circumference --      Peak Flow --      Pain Score 08/16/19 1135 3     Pain Loc --      Pain Edu? --      Excl. in [place]? --    Constitutional: Alert  and oriented. Well appearing and in no distress. Eyes: Normal exam ENT      Head: Normocephalic and atraumatic.      Mouth/Throat: Mucous membranes are moist. Cardiovascular: Normal rate, regular rhythm. Respiratory: Normal respiratory effort without tachypnea nor retractions. Breath sounds are clear  Gastrointestinal: Soft, moderate left lower quadrant abdominal tenderness to palpation.  Mild rebound tenderness.  No guarding.  No distention. Musculoskeletal: Nontender with normal range of motion in all extremities.  Neurologic:  Normal speech and language. No gross focal neurologic deficits  Skin:  Skin is warm, dry and intact.  Psychiatric: Mood and affect are  normal.  ____________________________________________   RADIOLOGY  IMPRESSION:  1. Short-segment focus of colitis in proximal sigmoid colon, located  in the anterior abdominal-pelvic junction region with localized  epiploic appendagitis in this area. No diverticulitis evident. No  other inflammatory change involving bowel.   2. No bowel obstruction. No abscess in the abdomen or pelvis.  Appendix appears normal.   3. No renal or ureteral calculus. No hydronephrosis. Urinary bladder  wall thickness normal.   ____________________________________________   INITIAL IMPRESSION / ASSESSMENT AND PLAN / ED COURSE  Pertinent labs & imaging results that were available during my care of the patient were reviewed by me and considered in my medical decision making (see chart for details).   Patient presents emergency department for left lower quadrant abdominal pain intermittent times months but worse over the past 24 to 48 hours.  We will check labs, proceed with CT imaging of the abdomen/pelvis to further evaluate.  Differential is quite broad at this time would include colitis, diverticulitis, IBS or IBD, ureterolithiasis, pyelonephritis urinary tract infection ovarian cyst.  Lab work is largely nonrevealing.  CT scan does show a short segment of focal colitis in the sigmoid colon.  We will place the patient on Augmentin.  We will refer to GI medicine as the patient will likely require a colonoscopy in the future once her acute colitis has resolved.  Patient agreeable to plan of care.    [name] was evaluated in Emergency Department on 08/16/2019 for the symptoms described in the history of present illness. She was evaluated in the context of the global COVID-19 pandemic, which necessitated consideration that the patient might be at risk for infection with the SARS-CoV-2 virus that causes COVID-19. Institutional protocols and algorithms that pertain to the evaluation of patients at risk  for COVID-19 are in a state of rapid change based on information released by regulatory bodies including the CDC and federal and state organizations. These policies and algorithms were followed during the patient's care in the ED.  ____________________________________________   FINAL CLINICAL IMPRESSION(S) / ED DIAGNOSES  Left lower quadrant abdominal pain Colitis   Minna Antis, MD 08/16/19 1410

## 2024-01-10 ENCOUNTER — Other Ambulatory Visit: Payer: Self-pay | Admitting: Obstetrics and Gynecology

## 2024-01-10 DIAGNOSIS — Z1231 Encounter for screening mammogram for malignant neoplasm of breast: Secondary | ICD-10-CM
